# Patient Record
Sex: Male | Born: 1995 | Race: White | Hispanic: No | Marital: Single | Smoking: Never smoker
Health system: Southern US, Community
[De-identification: ages and names within clinical notes are randomized; demographics above are authoritative.]

## PROBLEM LIST (undated history)

## (undated) DIAGNOSIS — I370 Nonrheumatic pulmonary valve stenosis: Secondary | ICD-10-CM

## (undated) DIAGNOSIS — E119 Type 2 diabetes mellitus without complications: Secondary | ICD-10-CM

## (undated) HISTORY — PX: ANGIOPLASTY: SHX39

## (undated) HISTORY — PX: CARDIAC CATHETERIZATION: SHX172

---
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
## 2015-06-08 ENCOUNTER — Encounter (HOSPITAL_COMMUNITY): Payer: Self-pay | Admitting: Emergency Medicine

## 2015-06-08 ENCOUNTER — Observation Stay (HOSPITAL_COMMUNITY)
Admission: EM | Admit: 2015-06-08 | Discharge: 2015-06-09 | DRG: 639 | Disposition: A | Discharge status: Home / Self Care | Payer: 59 | Attending: Internal Medicine | Admitting: Internal Medicine

## 2015-06-08 DIAGNOSIS — R739 Hyperglycemia, unspecified: Secondary | ICD-10-CM

## 2015-06-08 DIAGNOSIS — Z833 Family history of diabetes mellitus: Secondary | ICD-10-CM

## 2015-06-08 DIAGNOSIS — E869 Volume depletion, unspecified: Secondary | ICD-10-CM | POA: Diagnosis present

## 2015-06-08 DIAGNOSIS — E119 Type 2 diabetes mellitus without complications: Secondary | ICD-10-CM | POA: Diagnosis not present

## 2015-06-08 DIAGNOSIS — E1165 Type 2 diabetes mellitus with hyperglycemia: Principal | ICD-10-CM | POA: Diagnosis present

## 2015-06-08 HISTORY — DX: Type 2 diabetes mellitus without complications: E11.9

## 2015-06-08 HISTORY — DX: Nonrheumatic pulmonary valve stenosis: I37.0

## 2015-06-08 LAB — URINALYSIS, ROUTINE W REFLEX MICROSCOPIC
Bilirubin Urine: NEGATIVE
Glucose, UA: >1000 mg/dL — AB
HGB URINE DIPSTICK: NEGATIVE
Ketones, ur: NEGATIVE mg/dL
Leukocytes, UA: NEGATIVE
Nitrite: NEGATIVE
PH: 7.5 (ref 5.0–8.0)
Protein, ur: NEGATIVE mg/dL
SPECIFIC GRAVITY, URINE: 1.041 — AB (ref 1.005–1.030)
UROBILINOGEN UA: 0.2 mg/dL (ref 0.0–1.0)

## 2015-06-08 LAB — CBC
HEMATOCRIT: 40.8 % (ref 39.0–52.0)
HEMOGLOBIN: 14.3 g/dL (ref 13.0–17.0)
MCH: 29.3 pg (ref 26.0–34.0)
MCHC: 35 g/dL (ref 30.0–36.0)
MCV: 83.6 fL (ref 78.0–100.0)
Platelets: 198 10*3/uL (ref 150–400)
RBC: 4.88 MIL/uL (ref 4.22–5.81)
RDW: 11.7 % (ref 11.5–15.5)
WBC: 6.3 10*3/uL (ref 4.0–10.5)

## 2015-06-08 LAB — CBG MONITORING, ED
GLUCOSE-CAPILLARY: 384 mg/dL — AB (ref 65–99)
GLUCOSE-CAPILLARY: 405 mg/dL — AB (ref 65–99)
Glucose-Capillary: 218 mg/dL — ABNORMAL HIGH (ref 65–99)

## 2015-06-08 LAB — BASIC METABOLIC PANEL
ANION GAP: 7 (ref 5–15)
BUN: 13 mg/dL (ref 6–20)
CHLORIDE: 100 mmol/L — AB (ref 101–111)
CO2: 26 mmol/L (ref 22–32)
Calcium: 9.3 mg/dL (ref 8.9–10.3)
Creatinine, Ser: 0.83 mg/dL (ref 0.61–1.24)
GFR calc Af Amer: >60 mL/min (ref >60)
GLUCOSE: 376 mg/dL — AB (ref 65–99)
POTASSIUM: 3.6 mmol/L (ref 3.5–5.1)
Sodium: 133 mmol/L — ABNORMAL LOW (ref 135–145)

## 2015-06-08 LAB — URINE MICROSCOPIC-ADD ON: Urine-Other: NONE SEEN

## 2015-06-08 LAB — GLUCOSE, CAPILLARY: Glucose-Capillary: 188 mg/dL — ABNORMAL HIGH (ref 65–99)

## 2015-06-08 MED ORDER — INSULIN REGULAR HUMAN 100 UNIT/ML IJ SOLN
INTRAMUSCULAR | Status: DC
  Administered 2015-06-08: 3.5 [IU]/h via INTRAVENOUS
  Filled 2015-06-08: qty 2.5

## 2015-06-08 MED ORDER — DEXTROSE-NACL 5-0.45 % IV SOLN
INTRAVENOUS | Status: DC
  Administered 2015-06-08: 21:00:00 via INTRAVENOUS

## 2015-06-08 MED ORDER — SODIUM CHLORIDE 0.9 % IV SOLN
1000.0000 mL | INTRAVENOUS | Status: DC
  Administered 2015-06-08: 1000 mL via INTRAVENOUS

## 2015-06-08 MED ORDER — SODIUM CHLORIDE 0.9 % IV SOLN
1000.0000 mL | Freq: Once | INTRAVENOUS | Status: AC
  Administered 2015-06-08: 1000 mL via INTRAVENOUS

## 2015-06-08 MED ORDER — INSULIN ASPART 100 UNIT/ML ~~LOC~~ SOLN
3.0000 [IU] | Freq: Three times a day (TID) | SUBCUTANEOUS | Status: DC
  Administered 2015-06-09: 3 [IU] via SUBCUTANEOUS

## 2015-06-08 MED ORDER — INSULIN GLARGINE 100 UNIT/ML ~~LOC~~ SOLN
8.0000 [IU] | Freq: Every day | SUBCUTANEOUS | Status: DC
  Administered 2015-06-08: 8 [IU] via SUBCUTANEOUS
  Filled 2015-06-08 (×2): qty 0.08

## 2015-06-08 MED ORDER — SODIUM CHLORIDE 0.9 % IV SOLN
INTRAVENOUS | Status: DC
  Administered 2015-06-08: via INTRAVENOUS

## 2015-06-08 MED ORDER — INSULIN ASPART 100 UNIT/ML ~~LOC~~ SOLN
0.0000 [IU] | Freq: Three times a day (TID) | SUBCUTANEOUS | Status: DC
  Administered 2015-06-09: 5 [IU] via SUBCUTANEOUS

## 2015-06-08 NOTE — H&P (Signed)
History and Physical  Scott Levy  ZOX:[id number]  DOB: 11-Feb-1996  DOA: 06/08/2015  Referring physician: Dr. Patria Mane PCP: No primary care provider on file.   Chief Complaint: "I've been peeing a lot and now my blood sugar is 400 mg/dL."  HPI: Scott Levy is a 19 y.o. male with no significant past medical history who presents with polyuria, polydipsia, and hyperglycemia.  The patient was in his usual state of health until the last few weeks when he started to notice frequent urination at night. He also noticed frequent dry mouth, and more vigorous thirst. In the last few days he suspected that he had diabetes and so he made an appointment for next week at an endocrinologist office this is on Tuesday. Then today he had one of his in-laws check his blood glucoses greater than 400 mg/dL so he went to urgent care where he was referred to the emergency room.  In the ED, the patient's blood sugar was greater than 400 mg/dL, he was slightly hyponatremic, but he had no acidosis or elevation in anion gap. His white blood cell count was normal. He was started on an insulin drip and admitted to [facility] for new onset diabetes.   Review of Systems:  Patient seen 9:13 PM on 06/08/2015. Pt complains of polyuria, polydipsia, dry mouth. Pt denies any prodromal illness, vision changes, abdominal pain, nausea, vomiting, malaise, weight loss.  Otherwise twelve systems were reviewed and were negative except as just noted or noted in the history of present illness.  Past Medical History  Diagnosis Date  . Pulmonary stenosis     age 72  . Diabetes mellitus without complication dx 06/08/15  The above past medical history was reviewed.  Past Surgical History  Procedure Laterality Date  . Angioplasty    . Cardiac catheterization  age 72  The above surgical history was reviewed.  Social History: Patient lives with his wife here in [place]. He owns his own business. He is a nonsmoker. He does not drink  alcohol daily.  No Known Allergies  Family History  Problem Relation Age of Onset  . Hypothyroidism Paternal Grandmother   . Diabetes type II Maternal Grandfather   . Pancreatic cancer Maternal Grandfather   There is also an uncle with recently diagnosed type 2 diabetes. There is a first cousin once removed who has type 1 diabetes.  Prior to Admission medications   Not on File    Physical Exam: BP 128/73 mmHg  Pulse 81  Temp(Src) 98.2 F (36.8 C) (Oral)  Resp 18  Ht 6' (1.829 m)  Wt 79.379 kg (175 lb)  BMI 23.73 kg/m2  SpO2 100% General: Adult male, alert and in obvious distress.  Responds appropriately to questions.  Eye contact, dress and hygiene appropriate. HEENT: Head normal.  Corneas clear, conjunctivae and sclerae normal without injection or icterus, lids and lashes normal.  PERRL and EOMI.  Nose normal.  OP moist without erythema, exudates, cobblestoning, or ulcers.  No airway deformities.  Neck supple. No thyromegaly.  Lymph: No cervical or axillary lymphadenopathy. Skin: Warm and dry.  No jaundice.  No suspicious rashes or lesions. Cardiac: RRR, nl S1-S2, no murmurs appreciated.  Capillary refill is less than 2 seconds.  No LE edema.  Radial and DP pulses 2+ and symmetric. Respiratory: Normal respiratory rate and rhythm.  CTAB without rales or wheezes. Abdomen: BS present.  No TTP or rebound all quadrants.  No masses or organomegaly.  No striae, dilated veins, rashes, or lesions.  No ascites, distension. Neuro: Sensorium intact.  Cranial nerves 3-12 intact.  Speech is fluent.  Naming is grossly intact, and the patient's recall, recent and remote, as well as general fund of knowledge seem within normal limits.  Muscle bulk normal.  5/5 grip strength and elbow and wrist flexion/extension, symmetrically.  5/5 hip flexion symmetrically.  Moves all extremities equally and with normal coordination.    Psych: Appropriate affect.  Normal rate and rhythm of speech.  Thought content  appropriate, and thought process linear.  No evidence of aural or visual hallucinations or delusions. Attention and concentration are normal.           Labs on Admission:  The metabolic panel is notable for hyponatremia with sodium 133 mmol/L, low normal potassium, normal bicarbonate, and normal renal function. The anion gap is 7. Blood glucose on his basic metabolic panel is 376 kg/dL.. The complete blood count is notable for normal WBC, no evidence of anemia. The urinalysis has marked glucosuria but no ketones.     Assessment/Plan Principal Problem:   Diabetes mellitus, new onset    1. New onset diabetes:  The patient is young, normal weight, and does not have an extensive family history of type 2 diabetes. -Discontinue insulin gtt -Start basal bolus regimen at 0.2 units per kilogram -Lantus 8 units nightly -Aspart 3 units 3 times daily with meals -Sliding scale corrections -Bedside glucose 3 times daily with meals at bedtime and at 3 AM -GAD65 and islet cell autoantibodies -Diabetes educator -Nutrition consult -The patient actually has an endocrinology appointment for this problem next Tuesday with Dr. Juleen China of [facility].     DVT PPx: Low risk Diet: Diabetic Code Status: Full Family Communication: The patient's diagnosis, expected workup and treatment plan were discussed with his wife and father present at the bedside.  All questions were answered.  Disposition Plan:  The appropriate admission status for this patient is INPATIENT. Inpatient status is judged to be reasonable and necessary in order to provide the required intensity of service to ensure the patient's safety. The patient's presenting symptoms, physical exam findings, and initial radiographic and laboratory data in the context of their chronic comorbidities is felt to place them at high risk for further clinical deterioration. Furthermore, it is not anticipated that the patient will be  medically stable for discharge from the hospital within 2 midnights of admission. The following factors support the admission status of inpatient.   A. The patient's presenting symptoms include polyuria, polydipsia. B. The worrisome physical exam findings include tachycardia. C. The initial radiographic and laboratory data are worrisome because of hyperglycemia. D. Patient requires inpatient status due to high intensity of service, high risk for further deterioration and high frequency of surveillance required. E. I certify that at the point of admission it is my clinical judgment that the patient will require inpatient hospital care spanning beyond 2 midnights from the point of admission.     Alberteen Sam [facility] Pager [phone]

## 2015-06-08 NOTE — ED Notes (Signed)
[facility] sent from [facility] for [facility]cemia. Pt has had excessive thirst, frequent urination, dry mouth over the past 2 weeks.  Pt has grandfather that was type 2 DM.

## 2015-06-08 NOTE — ED Provider Notes (Signed)
CSN: [id number]     Arrival date & time 06/08/15  4098 History   First MD Initiated Contact with Patient 06/08/15 1904     Chief Complaint  Patient presents with  . Hyperglycemia  . Polydipsia  . Urinary Frequency      HPI Patient presents the emergency department with 7-9 days of polyuria and polydipsia.  No significant weight change.  No prior history of diabetes.  Denies fevers and chills.  Denies nausea and vomiting.  No abdominal pain.  Symptoms are mild to moderate in severity.  Checked his blood sugar at home with a family member is a diabetic and his blood sugar was noted to be in the 400s.  Went to urgent care today and noted that his blood sugar was too high to count on their blood glucose machine.   History reviewed. No pertinent past medical history. Past Surgical History  Procedure Laterality Date  . Angioplasty     No family history on file. Social History  Substance Use Topics  . Smoking status: Never Smoker   . Smokeless tobacco: None  . Alcohol Use: No    Review of Systems  All other systems reviewed and are negative.     Allergies  Review of patient's allergies indicates no known allergies.  Home Medications   Prior to Admission medications   Medication Sig Start Date End Date Taking? Authorizing Provider  ibuprofen (ADVIL,MOTRIN) 200 MG tablet Take 800 mg by mouth every 6 (six) hours as needed for moderate pain.   Yes Historical Provider, MD   BP 128/73 mmHg  Pulse 81  Temp(Src) 98.2 F (36.8 C) (Oral)  Resp 18  Ht 6' (1.829 m)  Wt 175 lb (79.379 kg)  BMI 23.73 kg/m2  SpO2 100% Physical Exam  Constitutional: He is oriented to person, place, and time. He appears well-developed and well-nourished.  HENT:  Head: Normocephalic and atraumatic.  Eyes: EOM are normal.  Neck: Normal range of motion.  Cardiovascular: Normal rate, regular rhythm, normal heart sounds and intact distal pulses.   Pulmonary/Chest: Effort normal and breath sounds normal.  No respiratory distress.  Abdominal: Soft. He exhibits no distension. There is no tenderness.  Musculoskeletal: Normal range of motion.  Neurological: He is alert and oriented to person, place, and time.  Skin: Skin is warm and dry.  Psychiatric: He has a normal mood and affect. Judgment normal.  Nursing note and vitals reviewed.   ED Course  Procedures (including critical care time) Labs Review Labs Reviewed  BASIC METABOLIC PANEL - Abnormal; Notable for the following:    Sodium 133 (*)    Chloride 100 (*)    Glucose, Bld 376 (*)    All other components within normal limits  URINALYSIS, ROUTINE W REFLEX MICROSCOPIC (NOT AT [facility]) - Abnormal; Notable for the following:    Specific Gravity, Urine 1.041 (*)    Glucose, UA >1000 (*)    All other components within normal limits  CBG MONITORING, ED - Abnormal; Notable for the following:    Glucose-Capillary 384 (*)    All other components within normal limits  CBG MONITORING, ED - Abnormal; Notable for the following:    Glucose-Capillary 405 (*)    All other components within normal limits  CBC  URINE MICROSCOPIC-ADD ON    Imaging Review No results found. I have personally reviewed and evaluated these images and lab results as part of my medical decision-making.   EKG Interpretation None      MDM  Final diagnoses:  None    Patient be admitted the hospital for new-onset diabetes.  No signs of DKA at this time.  Likely volume depleted.  IV hydration.  IV insulin.    Azalia Bilis, MD 06/08/15 2035

## 2015-06-09 DIAGNOSIS — R739 Hyperglycemia, unspecified: Secondary | ICD-10-CM | POA: Diagnosis not present

## 2015-06-09 DIAGNOSIS — E119 Type 2 diabetes mellitus without complications: Secondary | ICD-10-CM

## 2015-06-09 LAB — BASIC METABOLIC PANEL
ANION GAP: 6 (ref 5–15)
BUN: 16 mg/dL (ref 6–20)
CHLORIDE: 105 mmol/L (ref 101–111)
CO2: 27 mmol/L (ref 22–32)
Calcium: 9.1 mg/dL (ref 8.9–10.3)
Creatinine, Ser: 0.86 mg/dL (ref 0.61–1.24)
GFR calc Af Amer: >60 mL/min (ref >60)
GLUCOSE: 221 mg/dL — AB (ref 65–99)
POTASSIUM: 4 mmol/L (ref 3.5–5.1)
Sodium: 138 mmol/L (ref 135–145)

## 2015-06-09 LAB — GLUCOSE, CAPILLARY
GLUCOSE-CAPILLARY: 79 mg/dL (ref 65–99)
GLUCOSE-CAPILLARY: 147 mg/dL — AB (ref 65–99)
Glucose-Capillary: 205 mg/dL — ABNORMAL HIGH (ref 65–99)
Glucose-Capillary: 207 mg/dL — ABNORMAL HIGH (ref 65–99)

## 2015-06-09 MED ORDER — FREESTYLE SYSTEM KIT: 1.0000 | PACK | Freq: Three times a day (TID) | Status: AC

## 2015-06-09 MED ORDER — INSULIN STARTER KIT- PEN NEEDLES (ENGLISH)
1.0000 | Freq: Once | Status: AC
  Administered 2015-06-09: 1
  Filled 2015-06-09: qty 1

## 2015-06-09 MED ORDER — INSULIN GLARGINE 100 UNITS/ML SOLOSTAR PEN: 30.0000 [IU] | PEN_INJECTOR | Freq: Every day | SUBCUTANEOUS | Status: AC

## 2015-06-09 MED ORDER — INSULIN STARTER KIT- PEN NEEDLES (ENGLISH): 1.0000 | Freq: Once | Status: AC

## 2015-06-09 MED ORDER — LIVING WELL WITH DIABETES BOOK
Freq: Once | Status: AC
  Administered 2015-06-09: 12:00:00
  Filled 2015-06-09: qty 1

## 2015-06-09 MED ORDER — INSULIN PEN NEEDLE 32G X 4 MM MISC: 1.0000 "application " | Freq: Every day | Status: AC

## 2015-06-09 MED ORDER — ALCOHOL PADS 70 % PADS: 1.0000 "application " | MEDICATED_PAD | Freq: Three times a day (TID) | Status: AC

## 2015-06-09 MED ORDER — BLOOD GLUCOSE TEST VI STRP: 1.0000 "application " | ORAL_STRIP | Freq: Three times a day (TID) | Status: AC

## 2015-06-09 MED ORDER — INSULIN GLARGINE 100 UNIT/ML ~~LOC~~ SOLN: 30.0000 [IU] | Freq: Every day | SUBCUTANEOUS | Status: DC

## 2015-06-09 NOTE — Discharge Instructions (Signed)
Blood Glucose Monitoring °Monitoring your blood glucose (also know as blood sugar) helps you to manage your diabetes. It also helps you and your health care provider monitor your diabetes and determine how well your treatment plan is working. °WHY SHOULD YOU MONITOR YOUR BLOOD GLUCOSE? °· It can help you understand how food, exercise, and medicine affect your blood glucose. °· It allows you to know what your blood glucose is at any given moment. You can quickly tell if you are having low blood glucose (hypoglycemia) or high blood glucose (hyperglycemia). °· It can help you and your health care provider know how to adjust your medicines. °· It can help you understand how to manage an illness or adjust medicine for exercise. °WHEN SHOULD YOU TEST? °Your health care provider will help you decide how often you should check your blood glucose. This may depend on the type of diabetes you have, your diabetes control, or the types of medicines you are taking. Be sure to write down all of your blood glucose readings so that this information can be reviewed with your health care provider. See below for examples of testing times that your health care provider may suggest. °Type 1 Diabetes °· Test 4 times a day if you are in good control, using an insulin pump, or perform multiple daily injections. °· If your diabetes is not well controlled or if you are sick, you may need to monitor more often. °· It is a good idea to also monitor: °· Before and after exercise. °· Between meals and 2 hours after a meal. °· Occasionally between 2:00 a.m. and 3:00 a.m. °Type 2 Diabetes °· It can vary with each person, but generally, if you are on insulin, test 4 times a day. °· If you take medicines by mouth (orally), test 2 times a day. °· If you are on a controlled diet, test once a day. °· If your diabetes is not well controlled or if you are sick, you may need to monitor more often. °HOW TO MONITOR YOUR BLOOD GLUCOSE °Supplies  Needed °· Blood glucose meter. °· Test strips for your meter. Each meter has its own strips. You must use the strips that go with your own meter. °· A pricking needle (lancet). °· A device that holds the lancet (lancing device). °· A journal or log book to write down your results. °Procedure °· Wash your hands with soap and water. Alcohol is not preferred. °· Prick the side of your finger (not the tip) with the lancet. °· Gently milk the finger until a small drop of blood appears. °· Follow the instructions that come with your meter for inserting the test strip, applying blood to the strip, and using your blood glucose meter. °Other Areas to Get Blood for Testing °Some meters allow you to use other areas of your body (other than your finger) to test your blood. These areas are called alternative sites. The most common alternative sites are: °· The forearm. °· The thigh. °· The back area of the lower leg. °· The palm of the hand. °The blood flow in these areas is slower. Therefore, the blood glucose values you get may be delayed, and the numbers are different from what you would get from your fingers. Do not use alternative sites if you think you are having hypoglycemia. Your reading will not be accurate. Always use a finger if you are having hypoglycemia. Also, if you cannot feel your lows (hypoglycemia unawareness), always use your fingers for your   blood glucose checks. °ADDITIONAL TIPS FOR GLUCOSE MONITORING °· Do not reuse lancets. °· Always carry your supplies with you. °· All blood glucose meters have a 24-hour "hotline" number to call if you have questions or need help. °· Adjust (calibrate) your blood glucose meter with a control solution after finishing a few boxes of strips. °BLOOD GLUCOSE RECORD KEEPING °It is a good idea to keep a daily record or log of your blood glucose readings. Most glucose meters, if not all, keep your glucose records stored in the meter. Some meters come with the ability to download  your records to your home computer. Keeping a record of your blood glucose readings is especially helpful if you are wanting to look for patterns. Make notes to go along with the blood glucose readings because you might forget what happened at that exact time. Keeping good records helps you and your health care provider to work together to achieve good diabetes management.  °Document Released: 08/28/2003 Document Revised: 01/09/2014 Document Reviewed: 01/17/2013 °ExitCare® Patient Information ©2015 [employer]. This information is not intended to replace advice given to you by your health care provider. Make sure you discuss any questions you have with your health care provider. ° °Diabetes Mellitus and Food °It is important for you to manage your blood sugar (glucose) level. Your blood glucose level can be greatly affected by what you eat. Eating healthier foods in the appropriate amounts throughout the day at about the same time each day will help you control your blood glucose level. It can also help slow or prevent worsening of your diabetes mellitus. Healthy eating may even help you improve the level of your blood pressure and reach or maintain a healthy weight.  °HOW CAN FOOD AFFECT ME? °Carbohydrates °Carbohydrates affect your blood glucose level more than any other type of food. Your dietitian will help you determine how many carbohydrates to eat at each meal and teach you how to count carbohydrates. Counting carbohydrates is important to keep your blood glucose at a healthy level, especially if you are using insulin or taking certain medicines for diabetes mellitus. °Alcohol °Alcohol can cause sudden decreases in blood glucose (hypoglycemia), especially if you use insulin or take certain medicines for diabetes mellitus. Hypoglycemia can be a life-threatening condition. Symptoms of hypoglycemia (sleepiness, dizziness, and disorientation) are similar to symptoms of having too much alcohol.  °If your health  care provider has given you approval to drink alcohol, do so in moderation and use the following guidelines: °· Women should not have more than one drink per day, and men should not have more than two drinks per day. One drink is equal to: °¨ 12 oz of beer. °¨ 5 oz of wine. °¨ 1½ oz of hard liquor. °· Do not drink on an empty stomach. °· Keep yourself hydrated. Have water, diet soda, or unsweetened iced tea. °· Regular soda, juice, and other mixers might contain a lot of carbohydrates and should be counted. °WHAT FOODS ARE NOT RECOMMENDED? °As you make food choices, it is important to remember that all foods are not the same. Some foods have fewer nutrients per serving than other foods, even though they might have the same number of calories or carbohydrates. It is difficult to get your body what it needs when you eat foods with fewer nutrients. Examples of foods that you should avoid that are high in calories and carbohydrates but low in nutrients include: °· Trans fats (most processed foods list trans fats on the   Nutrition Facts label). °· Regular soda. °· Juice. °· Candy. °· Sweets, such as cake, pie, doughnuts, and cookies. °· Fried foods. °WHAT FOODS CAN I EAT? °Have nutrient-rich foods, which will nourish your body and keep you healthy. The food you should eat also will depend on several factors, including: °· The calories you need. °· The medicines you take. °· Your weight. °· Your blood glucose level. °· Your blood pressure level. °· Your cholesterol level. °You also should eat a variety of foods, including: °· Protein, such as meat, poultry, fish, tofu, nuts, and seeds (lean animal proteins are best). °· Fruits. °· Vegetables. °· Dairy products, such as milk, cheese, and yogurt (low fat is best). °· Breads, grains, pasta, cereal, rice, and beans. °· Fats such as olive oil, trans fat-free margarine, canola oil, avocado, and olives. °DOES EVERYONE WITH DIABETES MELLITUS HAVE THE SAME MEAL PLAN? °Because every  person with diabetes mellitus is different, there is not one meal plan that works for everyone. It is very important that you meet with a dietitian who will help you create a meal plan that is just right for you. °Document Released: 05/22/2005 Document Revised: 08/30/2013 Document Reviewed: 07/22/2013 °ExitCare® Patient Information ©2015 [employer]. This information is not intended to replace advice given to you by your health care provider. Make sure you discuss any questions you have with your health care provider. ° °

## 2015-06-09 NOTE — Progress Notes (Signed)
Patient watching educational videos on diabetes.

## 2015-06-09 NOTE — Progress Notes (Signed)
Patient discharged and leaving floor accompanied by mother.  NT accompanying patient and mother upon leaving floor.  Patient ambulatory at discharge.  No complaints.  No s/s of hypo or hyperglycemia.  Leaving with prescriptions and education materials regarding diabetes.  Denies pain.  Room air.  No s/s of distress.

## 2015-06-09 NOTE — Progress Notes (Signed)
Inpatient Diabetes Program Recommendations  AACE/ADA: New Consensus Statement on Inpatient Glycemic Control (2015)  Target Ranges:  Prepandial:   less than 140 mg/dL      Peak postprandial:   less than 180 mg/dL (1-2 hours)      Critically ill patients:  140 - 180 mg/dL   Review of Glycemic Control  Diabetes history: None Current orders for Inpatient glycemic control: Lantus 8 units QHS, Novolog Moderate + Novolog 3 units TID meal coverage  Inpatient Diabetes Program Recommendations: Insulin - Basal: Fasting glucose was still greater than 200 mg/dl after 8 units of Lantus. Please consider increasing basal insulin to 12 units QHS. HgbA1C: A1c, GAD, Anti-islet pending Outpatient Referral: Patient has insurance and will qualify for Outpatient DM Education. Please place order when appropriate.  Note: Differentiating between DM 1 or 2. Ordered Living well with DM book. Dietitian consult already ordered by MD.  RN to have patient watch educational videos and teach patient survival skills before discharge: s/s hypoglycemia and treatment s/s hyperglycemia and treatment A1c results, goal is 7% or less (150 avg glucose) New medications they will be on Check glucose and how often (1-2 times for oral meds, 3-4 times a day with insulin) Teach how to administer insulin with each insulin administration dose scheduled Watching carb intake (45-60 g/females/meal 15 g/snack, 60-75 g/males 15-30 g/snack), plate method, watching beverage options.  Will wait for results of A1c, GAD, Anti-islet tests to determine level of education the patient needs. Will speak to patient before discharge once teaching has been started so I can reinforce education. If gaps exist patient qualifies for Outpatient Education.  Thanks,  Christena Deem RN, MSN, [facility] Inpatient Diabetes Coordinator Team Pager [phone] (8a-5p)

## 2015-06-09 NOTE — Plan of Care (Signed)
Problem: Food- and Nutrition-Related Knowledge Deficit (NB-1.1) Goal: Nutrition education Formal process to instruct or train a patient/client in a skill or to impart knowledge to help patients/clients voluntarily manage or modify food choices and eating behavior to maintain or improve health. Outcome: Adequate for Discharge  RD consulted for nutrition education regarding diabetes.   No results found for: HGBA1C   Received page from RN at [facility]; pt is being discharged home today and MD is requesting RD consult prior to discharge. Spoke with pt via phone in room. He reports he is finishing watching the DM education videos. Pt had very thoughtful, insightful questions. 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 Usual diet is meat, vegetables, and water. He is agreeable to Outpatient DSME, which has been set-up. Pt agreeable to have handouts sent to his home for further reinforcement.   RD provided "Carbohydrate Counting for People with Diabetes" handout from the Academy of Nutrition and Dietetics. Discussed different food groups and their effects on blood sugar, emphasizing carbohydrate-containing foods. Provided list of carbohydrates and recommended serving sizes of common foods.  Discussed importance of controlled and consistent carbohydrate intake throughout the day. Provided examples of ways to balance meals/snacks and encouraged intake of high-fiber, whole grain complex carbohydrates. Teach back method used.  Expect good compliance.  Body mass index is 24.24 kg/(m^2). Pt meets criteria for normal weight range based on current BMI.  Current diet order is Carb Modified, patient is consuming approximately 100% of meals at this time. Labs and medications reviewed. No further nutrition interventions warranted at this time. RD contact information provided. If additional nutrition issues arise, please re-consult RD.  Meagan Ancona A. Mayford Knife, RD, LDN,  CDE Pager: [phone] After hours Pager: [phone]

## 2015-06-09 NOTE — Progress Notes (Signed)
Patient being discharged.  Provided education on s/s of hypoglycemia and hyperglycemia and medications at discharge.  Patient reports he watched diabetes education videos.   Last CBG 147.

## 2015-06-09 NOTE — Discharge Summary (Addendum)
Physician Discharge Summary  Male Scott Levy:[id number] DOB: 01/11/1996 DOA: 06/08/2015  PCP: No primary care provider on file.  Admit date: 06/08/2015 Discharge date: 06/09/2015  Time spent: 25 minutes  Recommendations for Outpatient Follow-up:  1. Follow up with Dr Anda Kraft on 10/4 at 4 pm 2. Pt being discharged on lantus 30 units at bedtime. Follow A1C and islet cell ab results as outpt   Discharge Diagnoses:  Principal Problem:   Diabetes mellitus, new onset   Active Problems:   Hyperglycemia   Discharge Condition: fair  Diet recommendation: diabetic  Filed Weights   06/08/15 1858 06/08/15 1943 06/08/15 2155  Weight: 79.379 kg (175 lb) 79.379 kg (175 lb) 81.103 kg (178 lb 12.8 oz)    History of present illness:    19 y.o. male with no significant past medical history who presents with polyuria, polydipsia, and hyperglycemia. The patient was in his usual state of health until the last few weeks when he started to notice frequent urination at night. He also noticed frequent dry mouth, and more vigorous thirst. In the last few days he suspected that he had diabetes and so he made an appointment at an endocrinologist office this  Tuesday. On the day of admission,  he had one of his aunt  check his blood glucoses and was greater than 400 mg/dL so he went to urgent care where he was referred to the emergency room.  In the ED, the patient's blood sugar was greater than 400 mg/dL, he was slightly hyponatremic, but he had no acidosis or elevation in anion gap. His white blood cell count was normal. He was started on an insulin drip and admitted to [facility] for new onset diabetes.    Hospital Course:  Marland Kitchen New onset diabetes:  The patient is young, normal weight, and does has an extensive family history of type 2 diabetes. -fsg improves to 200s on insulin drip, was discontinued.  -given lantus 8 units in the night and monitored on premeal aspart. -A1C and GAD65 and islet cell  autoantibodies sent and should be followed as outpt.  fsg  this am improved to 79. -seen by Diabetes educator. nutritionist consulted.  Patient taught on using insulin pen and saw videos on diabetes. The patient  has an endocrinology appointment for this problem next Tuesday with Dr. Wilson Singer at [facility]. -he will be discharged home on lantus 30 units daily, prescribed insulin pen, and supplies incliding glucometer, needles , test trips and lancets. i have instructed him to monitor his fsg 3 times a day and keep a log of it to show it to Dr Wilson Singer.  discussed in detail on diet, exercise and medication adherence along with lifestyle modifications. - Pt feels better today. Polyuria and polydipsia resolved. Can be discharged home.    Diet: diabetic   Procedures:  none  Consultations:  None  Family communications:  dicussed in details with pt and his mother at bedside  Discharge Exam: Filed Vitals:   06/09/15 0456  BP: 122/74  Pulse: 62  Temp: 97.6 F (36.4 C)  Resp: 18    General:  NAD  HEENT: no pallor, moist mucosa Chest: clear b/l  CVS: NS1&S2, no murmurs GI: soft, NT, ND musculoskeletal: warm, no edema    Discharge Instructions    Current Discharge Medication List    START taking these medications   Details  Alcohol Swabs (ALCOHOL PADS) 70 % PADS 1 application by Does not apply route 3 (three) times daily. Qty: 100  each, Refills: 0    Glucose Blood (BLOOD GLUCOSE TEST STRIPS) STRP 1 application by In Vitro route 3 (three) times daily. Qty: 100 each, Refills: 0    glucose monitoring kit (FREESTYLE) monitoring kit 1 each by Does not apply route 4 (four) times daily - after meals and at bedtime. 1 month Diabetic Testing Supplies for QAC-QHS accuchecks. Qty: 1 each, Refills: 1    insulin glargine (LANTUS) 100 unit/mL SOPN Inject 0.3 mLs (30 Units total) into the skin at bedtime. Qty: 15 mL, Refills: 5    Insulin Pen Needle 32G X 4 MM MISC  1 application by Does not apply route at bedtime. Qty: 100 each, Refills: 0    insulin starter kit- pen needles MISC 1 kit by Other route once. Qty: 1 kit, Refills: 0       No Known Allergies Follow-up Information    Follow up with Scott Bolt, MD On 06/12/2015.   Specialty:  Endocrinology   Why:  4 pm   Contact information:   [address] [place] [address] [phone]        The results of significant diagnostics from this hospitalization (including imaging, microbiology, ancillary and laboratory) are listed below for reference.    Significant Diagnostic Studies: No results found.  Microbiology: No results found for this or any previous visit (from the past 240 hour(s)).   Labs: Basic Metabolic Panel:  Recent Labs Lab 06/08/15 1940 06/09/15 0601  NA 133* 138  K 3.6 4.0  CL 100* 105  CO2 26 27  GLUCOSE 376* 221*  BUN 13 16  CREATININE 0.83 0.86  CALCIUM 9.3 9.1   Liver Function Tests: No results for input(s): AST, ALT, ALKPHOS, BILITOT, PROT, ALBUMIN in the last 168 hours. No results for input(s): LIPASE, AMYLASE in the last 168 hours. No results for input(s): AMMONIA in the last 168 hours. CBC:  Recent Labs Lab 06/08/15 1940  WBC 6.3  HGB 14.3  HCT 40.8  MCV 83.6  PLT 198   Cardiac Enzymes: No results for input(s): CKTOTAL, CKMB, CKMBINDEX, TROPONINI in the last 168 hours. BNP: BNP (last 3 results) No results for input(s): BNP in the last 8760 hours.  ProBNP (last 3 results) No results for input(s): PROBNP in the last 8760 hours.  CBG:  Recent Labs Lab 06/08/15 2057 06/08/15 2300 06/09/15 0455 06/09/15 0750 06/09/15 1058  GLUCAP 218* 188* 205* 207* 79       Signed:  Brynda Heick  [facility] 06/09/2015, 11:04 AM

## 2015-06-09 NOTE — Progress Notes (Signed)
Spoke with Dietician.  She is at [facility] and reports she will call patient in his room to speak with him about diet within the next 15 minutes.

## 2015-06-09 NOTE — Progress Notes (Signed)
Pt arrived to unit room 1518. Alert and oriented x 3, steady gait, no complaint of pain. VS taken, pt oriented to room and callbell with no complications.. Pt guide at the bedside. Initial assessment completed, will continue to monitor.

## 2015-06-11 LAB — ANTI-ISLET CELL ANTIBODY: PANCREATIC ISLET CELL ANTIBODY: NEGATIVE

## 2015-06-11 LAB — HEMOGLOBIN A1C
HEMOGLOBIN A1C: 9.6 % — AB (ref 4.8–5.6)
MEAN PLASMA GLUCOSE: 229 mg/dL

## 2015-06-12 LAB — GLUTAMIC ACID DECARBOXYLASE AUTO ABS: Glutamic Acid Decarb Ab: 52.9 U/mL — ABNORMAL HIGH (ref 0.0–5.0)

## 2019-07-28 ENCOUNTER — Other Ambulatory Visit: Payer: Self-pay

## 2019-07-28 DIAGNOSIS — Z20822 Contact with and (suspected) exposure to covid-19: Secondary | ICD-10-CM

## 2019-07-31 LAB — NOVEL CORONAVIRUS, NAA: SARS-CoV-2, NAA: NOT DETECTED

## 2020-12-23 ENCOUNTER — Emergency Department (HOSPITAL_COMMUNITY): Payer: 59

## 2020-12-23 ENCOUNTER — Other Ambulatory Visit: Payer: Self-pay

## 2020-12-23 ENCOUNTER — Emergency Department (HOSPITAL_COMMUNITY)
Admission: EM | Admit: 2020-12-23 | Discharge: 2020-12-23 | Disposition: A | Discharge status: Home / Self Care | Payer: 59 | Attending: Emergency Medicine | Admitting: Emergency Medicine

## 2020-12-23 ENCOUNTER — Encounter (HOSPITAL_COMMUNITY): Payer: Self-pay | Admitting: Emergency Medicine

## 2020-12-23 DIAGNOSIS — R1011 Right upper quadrant pain: Secondary | ICD-10-CM | POA: Diagnosis present

## 2020-12-23 DIAGNOSIS — Z794 Long term (current) use of insulin: Secondary | ICD-10-CM | POA: Diagnosis not present

## 2020-12-23 DIAGNOSIS — E119 Type 2 diabetes mellitus without complications: Secondary | ICD-10-CM | POA: Insufficient documentation

## 2020-12-23 DIAGNOSIS — R1110 Vomiting, unspecified: Secondary | ICD-10-CM | POA: Diagnosis not present

## 2020-12-23 DIAGNOSIS — R1013 Epigastric pain: Secondary | ICD-10-CM | POA: Diagnosis not present

## 2020-12-23 DIAGNOSIS — M545 Low back pain, unspecified: Secondary | ICD-10-CM

## 2020-12-23 LAB — COMPREHENSIVE METABOLIC PANEL
ALT: 19 U/L (ref 0–44)
AST: 17 U/L (ref 15–41)
Albumin: 4.5 g/dL (ref 3.5–5.0)
Alkaline Phosphatase: 65 U/L (ref 38–126)
Anion gap: 9 (ref 5–15)
BUN: 12 mg/dL (ref 6–20)
CO2: 24 mmol/L (ref 22–32)
Calcium: 9.5 mg/dL (ref 8.9–10.3)
Chloride: 102 mmol/L (ref 98–111)
Creatinine, Ser: 0.88 mg/dL (ref 0.61–1.24)
GFR, Estimated: >60 mL/min (ref >60)
Glucose, Bld: 201 mg/dL — ABNORMAL HIGH (ref 70–99)
Potassium: 4 mmol/L (ref 3.5–5.1)
Sodium: 135 mmol/L (ref 135–145)
Total Bilirubin: 1.4 mg/dL — ABNORMAL HIGH (ref 0.3–1.2)
Total Protein: 7.4 g/dL (ref 6.5–8.1)

## 2020-12-23 LAB — CBC
HCT: 49.2 % (ref 39.0–52.0)
Hemoglobin: 16.8 g/dL (ref 13.0–17.0)
MCH: 29 pg (ref 26.0–34.0)
MCHC: 34.1 g/dL (ref 30.0–36.0)
MCV: 84.8 fL (ref 80.0–100.0)
Platelets: 222 10*3/uL (ref 150–400)
RBC: 5.8 MIL/uL (ref 4.22–5.81)
RDW: 11.4 % — ABNORMAL LOW (ref 11.5–15.5)
WBC: 6.9 10*3/uL (ref 4.0–10.5)
nRBC: 0 % (ref 0.0–0.2)

## 2020-12-23 LAB — URINALYSIS, ROUTINE W REFLEX MICROSCOPIC
Bilirubin Urine: NEGATIVE
Glucose, UA: NEGATIVE mg/dL
Hgb urine dipstick: NEGATIVE
Ketones, ur: NEGATIVE mg/dL
Leukocytes,Ua: NEGATIVE
Nitrite: NEGATIVE
Protein, ur: NEGATIVE mg/dL
Specific Gravity, Urine: 1.019 (ref 1.005–1.030)
pH: 5 (ref 5.0–8.0)

## 2020-12-23 LAB — CBG MONITORING, ED: Glucose-Capillary: 190 mg/dL — ABNORMAL HIGH (ref 70–99)

## 2020-12-23 LAB — LIPASE, BLOOD: Lipase: 30 U/L (ref 11–51)

## 2020-12-23 MED ORDER — IOHEXOL 300 MG/ML  SOLN
100.0000 mL | Freq: Once | INTRAMUSCULAR | Status: AC | PRN
  Administered 2020-12-23: 100 mL via INTRAVENOUS

## 2020-12-23 NOTE — ED Provider Notes (Signed)
[place] EMERGENCY DEPARTMENT Provider Note   CSN: [id number] Arrival date & time: 12/23/20  1239     History Chief Complaint  Patient presents with  . Flank Pain    Scott Levy is a 25 y.o. male.  25 year old male presents with right upper quadrant abdominal pain that began after eating a hamburger several days ago.  No fever or chills.  With emesis x1 today.  Pain starts in his epigastric area and goes her upper quadrant into his back.  No cough or congestion.  No urinary symptoms.  No prior history of same.  Concerned that he may be having constipation has been using laxatives without relief.  Does have a history of diabetes        Past Medical History:  Diagnosis Date  . Diabetes mellitus without complication ([place]) dx 0/96/43  . Pulmonary stenosis    age 89    Patient Active Problem List   Diagnosis Date Noted  . Hyperglycemia 06/09/2015  . Diabetes mellitus, new onset ([place]) 06/08/2015    Past Surgical History:  Procedure Laterality Date  . ANGIOPLASTY    . CARDIAC CATHETERIZATION  age 89       Family History  Problem Relation Age of Onset  . Hypothyroidism Paternal Grandmother   . Diabetes type II Maternal Grandfather   . Pancreatic cancer Maternal Grandfather     Social History   Tobacco Use  . Smoking status: Never Smoker  . Smokeless tobacco: Never Used  Substance Use Topics  . Alcohol use: No  . Drug use: Not Currently    Home Medications Prior to Admission medications   Medication Sig Start Date End Date Taking? Authorizing Provider  Alcohol Swabs (ALCOHOL PADS) 70 % PADS 1 application by Does not apply route 3 (three) times daily. 06/09/15   Dhungel, Nishant, MD  Glucose Blood (BLOOD GLUCOSE TEST STRIPS) STRP 1 application by In Vitro route 3 (three) times daily. 06/09/15   Dhungel, Flonnie Overman, MD  glucose monitoring kit (FREESTYLE) monitoring kit 1 each by Does not apply route 4 (four) times daily - after meals and at  bedtime. 1 month Diabetic Testing Supplies for QAC-QHS accuchecks. 06/09/15   Dhungel, Nishant, MD  insulin glargine (LANTUS) 100 unit/mL SOPN Inject 0.3 mLs (30 Units total) into the skin at bedtime. 06/09/15   Dhungel, Nishant, MD  Insulin Pen Needle 32G X 4 MM MISC 1 application by Does not apply route at bedtime. 06/09/15   Dhungel, Flonnie Overman, MD  insulin starter kit- pen needles MISC 1 kit by Other route once. 06/09/15   Dhungel, Flonnie Overman, MD    Allergies    Patient has no known allergies.  Review of Systems   Review of Systems  All other systems reviewed and are negative.   Physical Exam Updated Vital Signs BP (!) 148/89 (BP Location: Right Arm)   Pulse 69   Temp 98.5 F (36.9 C) (Oral)   Resp 18   SpO2 100%   Physical Exam Vitals and nursing note reviewed.  Constitutional:      General: He is not in acute distress.    Appearance: Normal appearance. He is well-developed. He is not toxic-appearing.  HENT:     Head: Normocephalic and atraumatic.  Eyes:     General: Lids are normal.     Conjunctiva/sclera: Conjunctivae normal.     Pupils: Pupils are equal, round, and reactive to light.  Neck:     Thyroid: No thyroid mass.  Trachea: No tracheal deviation.  Cardiovascular:     Rate and Rhythm: Normal rate and regular rhythm.     Heart sounds: Normal heart sounds. No murmur heard. No gallop.   Pulmonary:     Effort: Pulmonary effort is normal. No respiratory distress.     Breath sounds: Normal breath sounds. No stridor. No decreased breath sounds, wheezing, rhonchi or rales.  Abdominal:     General: Bowel sounds are normal. There is no distension.     Palpations: Abdomen is soft.     Tenderness: There is abdominal tenderness in the right upper quadrant and epigastric area. There is guarding. There is no rebound.    Musculoskeletal:        General: No tenderness. Normal range of motion.     Cervical back: Normal range of motion and neck supple.  Skin:    General: Skin  is warm and dry.     Findings: No abrasion or rash.  Neurological:     Mental Status: He is alert and oriented to person, place, and time.     GCS: GCS eye subscore is 4. GCS verbal subscore is 5. GCS motor subscore is 6.     Cranial Nerves: No cranial nerve deficit.     Sensory: No sensory deficit.  Psychiatric:        Speech: Speech normal.        Behavior: Behavior normal.     ED Results / Procedures / Treatments   Labs (all labs ordered are listed, but only abnormal results are displayed) Labs Reviewed  CBC - Abnormal; Notable for the following components:      Result Value   RDW 11.4 (*)    All other components within normal limits  CBG MONITORING, ED - Abnormal; Notable for the following components:   Glucose-Capillary 190 (*)    All other components within normal limits  LIPASE, BLOOD  COMPREHENSIVE METABOLIC PANEL  URINALYSIS, ROUTINE W REFLEX MICROSCOPIC    EKG None  Radiology No results found.  Procedures Procedures   Medications Ordered in ED Medications - No data to display  ED Course  I have reviewed the triage vital signs and the nursing notes.  Pertinent labs & imaging results that were available during my care of the patient were reviewed by me and considered in my medical decision making (see chart for details).    MDM Rules/Calculators/A&P                         Labs here are reassuring.  Right upper quadrant ultrasound negative.  Abdominal CT ordered and pending at this time.  Signed out to Dr. Ronnald Nian Final Clinical Impression(s) / ED Diagnoses Final diagnoses:  None    Rx / DC Orders ED Discharge Orders    None       Lacretia Leigh, MD 12/23/20 [phone]

## 2020-12-23 NOTE — ED Notes (Signed)
Patient transported to US 

## 2020-12-23 NOTE — Discharge Instructions (Addendum)
Recommend 1000 mg of Tylenol 4 times a day as needed for pain.  Recommend 600 mg of ibuprofen 3 times a day as needed.  Do not do any heavy lifting for the next week.  Follow-up with your primary care doctor to discuss if you need MRI to further evaluate for likely hemangioma of your liver.

## 2020-12-23 NOTE — ED Provider Notes (Signed)
Patient with bilateral flank pain.  CT scan negative for kidney stone.  No appendicitis or bowel obstruction.  Gallbladder and liver enzymes within normal limits.  Likely hemangioma on CT scan and ultrasound but recommend follow-up with primary care doctor to discuss possible need for MRI to ensure that this is a hemangioma.  Overall suspect musculoskeletal pain.  Discharged in good condition.  Understands return precautions.  This chart was dictated using voice recognition software.  Despite best efforts to proofread,  errors can occur which can change the documentation meaning.     Virgina Norfolk, DO 12/23/20 [phone]

## 2020-12-23 NOTE — ED Triage Notes (Signed)
Reports indigestion after eating burgers on Friday.  States abd pain started radiating to lower back.  Now having bilateral flank pain and nausea.  Vomited x 1 this morning.  Denies urinary complaints.  Reports constipation.  Took Senokot and had a few BMs but still having pain.

## 2020-12-23 NOTE — ED Notes (Signed)
Patient given discharge paperwork and instructions. Verbalized understanding of teaching. IV d/c with cath tip intact. Ambulatory to exit in NAD with steady gait. 

## 2022-02-09 IMAGING — CT CT ABD-PELV W/ CM
2 of 4 series · 16 of 46 positions shown, 18 images · IV contrast (APPLIED)
Comparison: Ultrasound from earlier in the same day.

CLINICAL DATA: Abdominal distension for 1 day

EXAM:
CT ABDOMEN AND PELVIS WITH CONTRAST
TECHNIQUE: Multidetector CT imaging of the abdomen and pelvis was performed
using the standard protocol following bolus administration of
intravenous contrast.
CONTRAST:  100mL OMNIPAQUE IOHEXOL 300 MG/ML  SOLN

[Series 3: abd/ pelvis 5.0 i30f 2 · axial · 0.95mm/px · z∈[+741,+1181]mm · 13 of 97 slices shown, 15 images]
[im 5/97  soft-tissue]
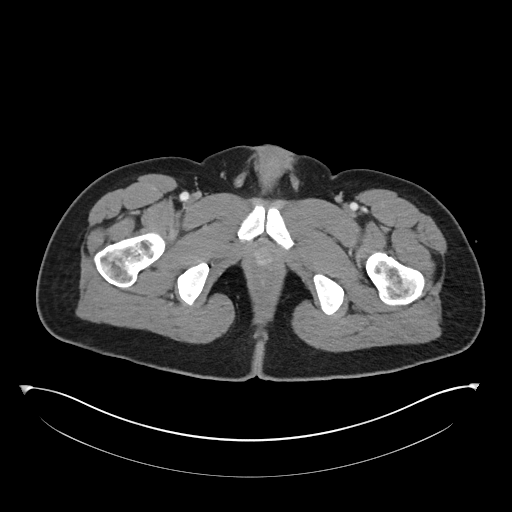
[im 5/97  bone]
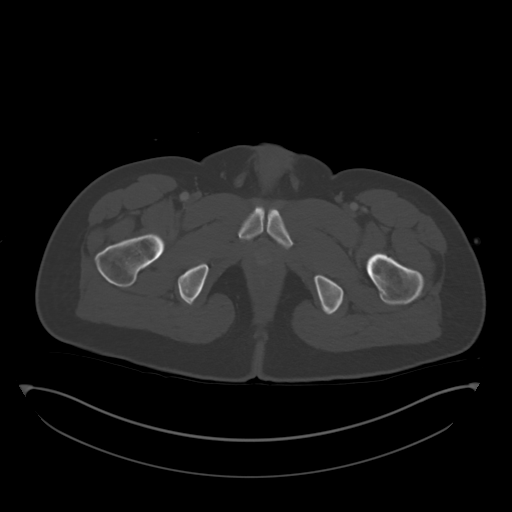
[im 13/97  soft-tissue]
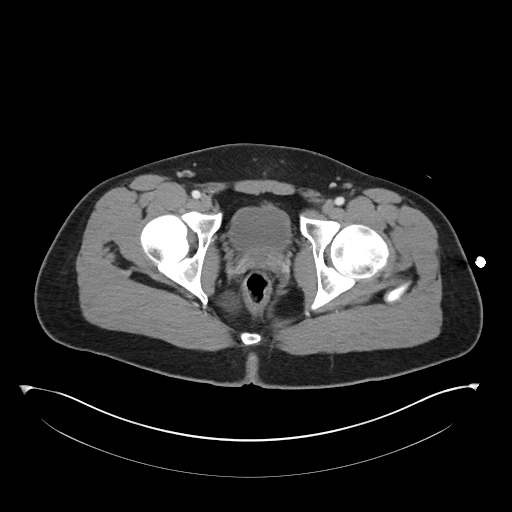
[im 21/97  soft-tissue]
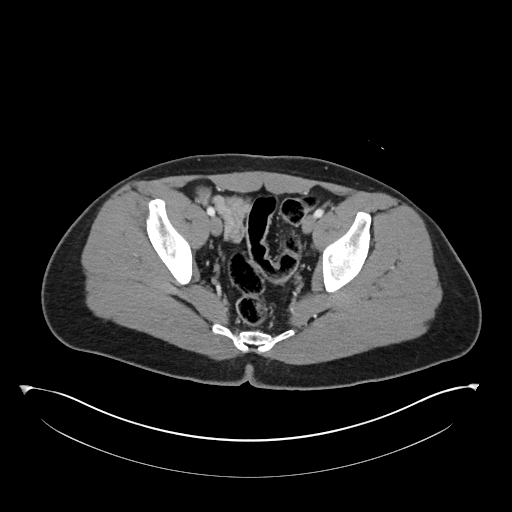
[im 29/97  soft-tissue]
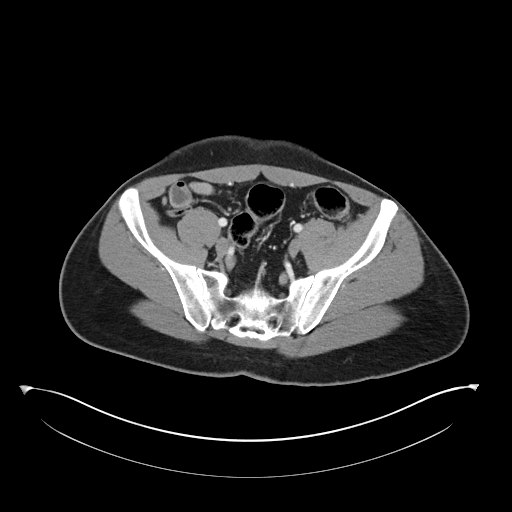
[im 33/97  soft-tissue]
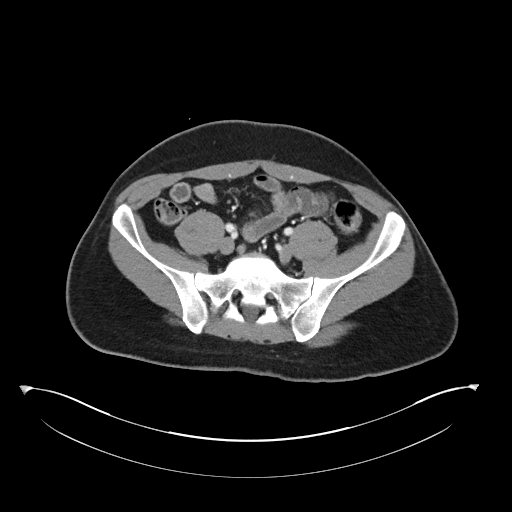
[im 41/97  soft-tissue]
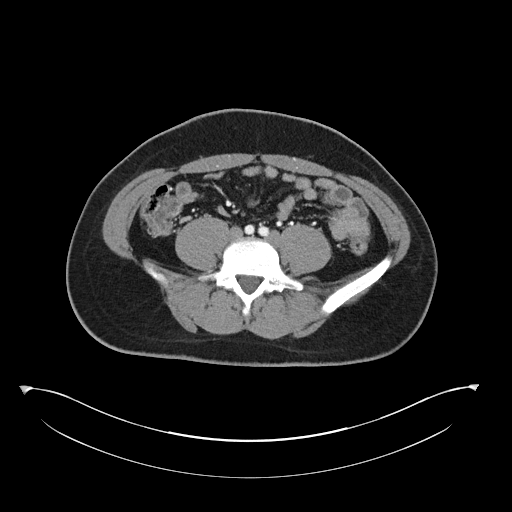
[im 49/97  soft-tissue]
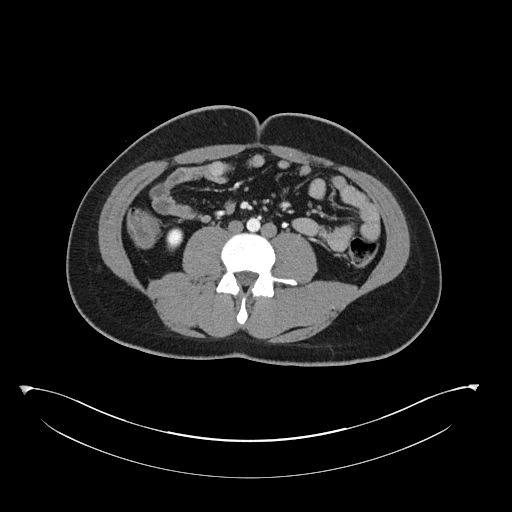
[im 57/97  soft-tissue]
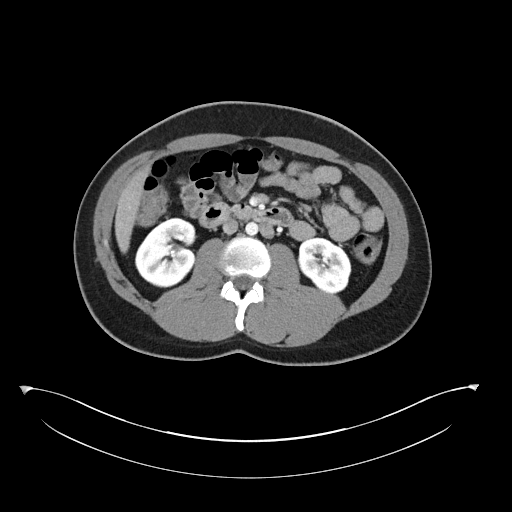
[im 65/97  soft-tissue]
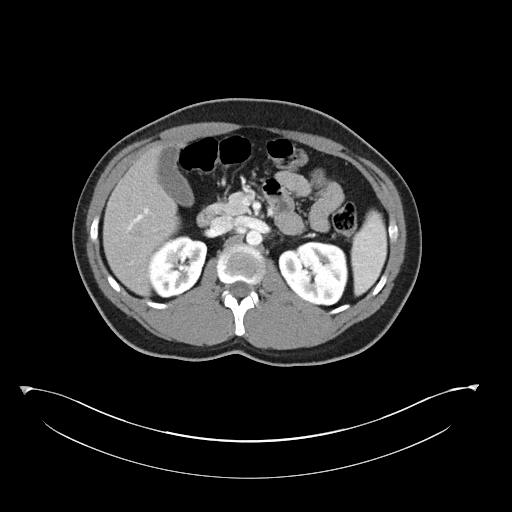
[im 65/97  bone]
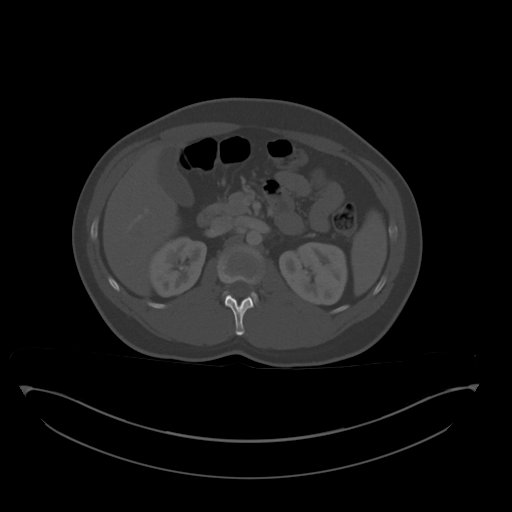
[im 69/97  soft-tissue]
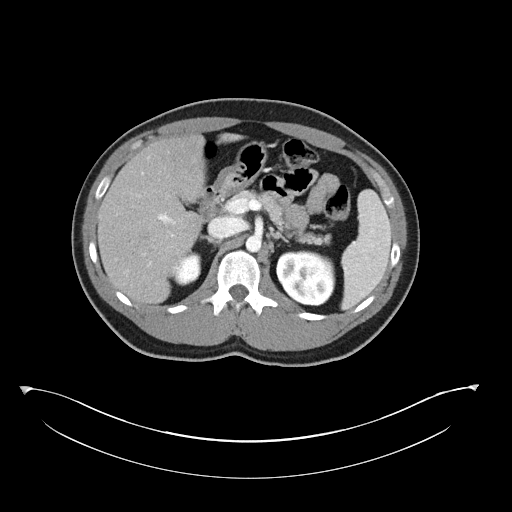
[im 77/97  soft-tissue]
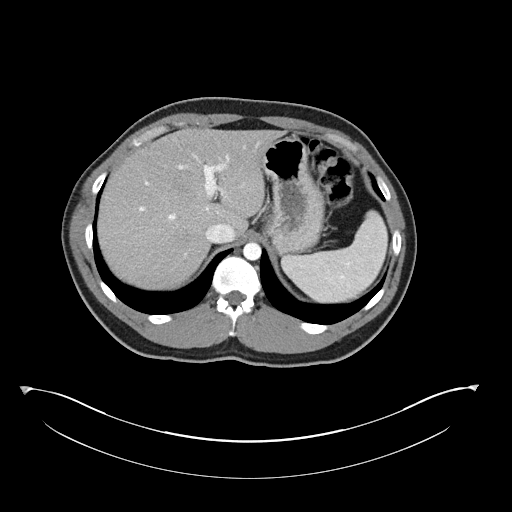
[im 85/97  soft-tissue]
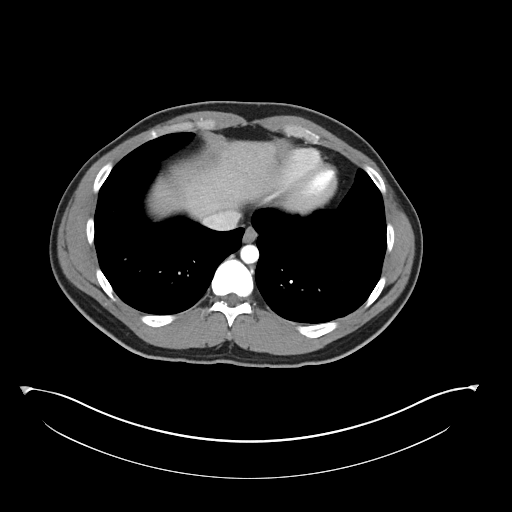
[im 93/97  soft-tissue]
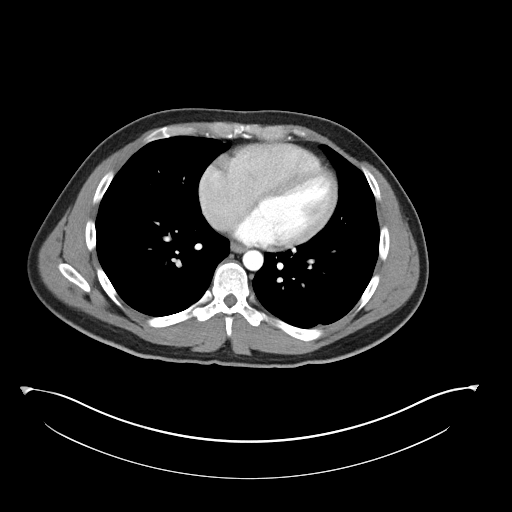

[Series 6: coronal soft tissue · coronal · 0.89mm/px · 3 of 92 slices shown]
[im 31/92  soft-tissue]
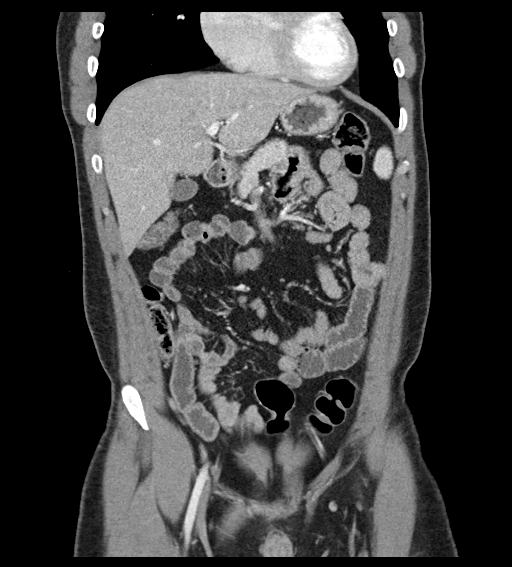
[im 41/92  soft-tissue]
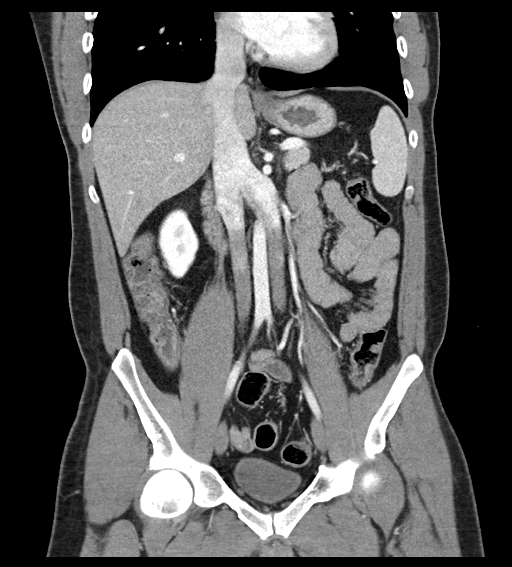
[im 51/92  soft-tissue]
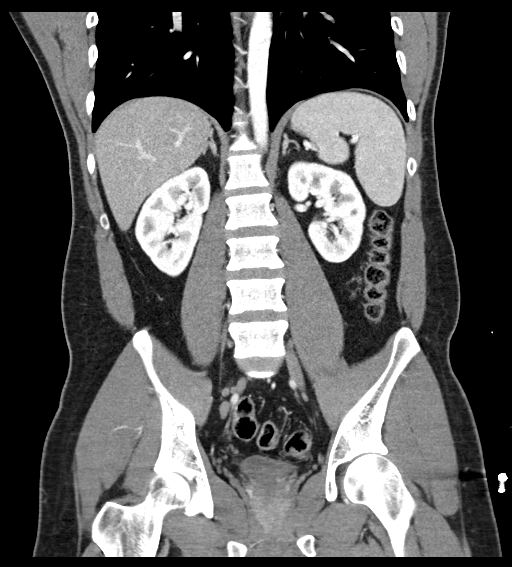

[16 of 46 positions shown; findings below may reference images not displayed]

FINDINGS: Lower chest: No acute abnormality.

Hepatobiliary: Mild fatty infiltration of the liver is noted.
Gallbladder is unremarkable. Small hypodensity is noted adjacent to
the gallbladder on image number 31 of series 3. This is incompletely
evaluated on this exam but was well seen on ultrasound from earlier
in the same day and felt to represent a small hemangioma.

Pancreas: Unremarkable. No pancreatic ductal dilatation or
surrounding inflammatory changes.

Spleen: Normal in size without focal abnormality.

Adrenals/Urinary Tract: Adrenal glands are within normal limits.
Kidneys are well visualized bilaterally. No renal calculi or urinary
tract obstructive changes are seen. Bladder is partially distended.

Stomach/Bowel: No obstructive or inflammatory changes are noted in
the colon. Stomach and small bowel appear within normal limits.
Appendix is within normal limits.

Vascular/Lymphatic: No significant vascular findings are present. No
enlarged abdominal or pelvic lymph nodes. Duplicated infrarenal IVC
is noted.

Reproductive: Prostate is unremarkable.

Other: No abdominal wall hernia or abnormality. No abdominopelvic
ascites.

Musculoskeletal: No acute or significant osseous findings.
IMPRESSION: Hypoechoic area in the liver similar to that seen on prior
ultrasound examination likely representing small hemangioma.

No other focal abnormality is noted.

## 2022-02-09 IMAGING — US US ABDOMEN LIMITED RUQ/ASCITES
1 series · 14 of 25 positions shown · non-contrast
Comparison: None.

CLINICAL DATA: Right upper quadrant pain

EXAM:
ULTRASOUND ABDOMEN LIMITED RIGHT UPPER QUADRANT

[Series 1: us abdomen limited · 14 of 59 slices shown]
[im 1/59]
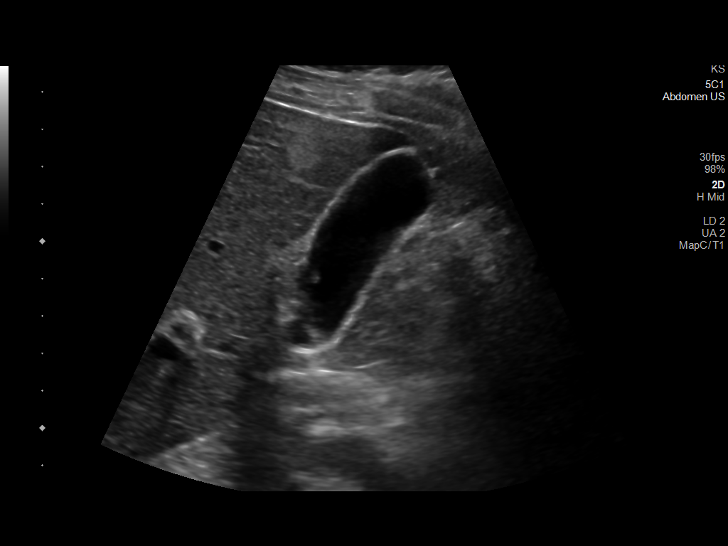
[im 5/59]
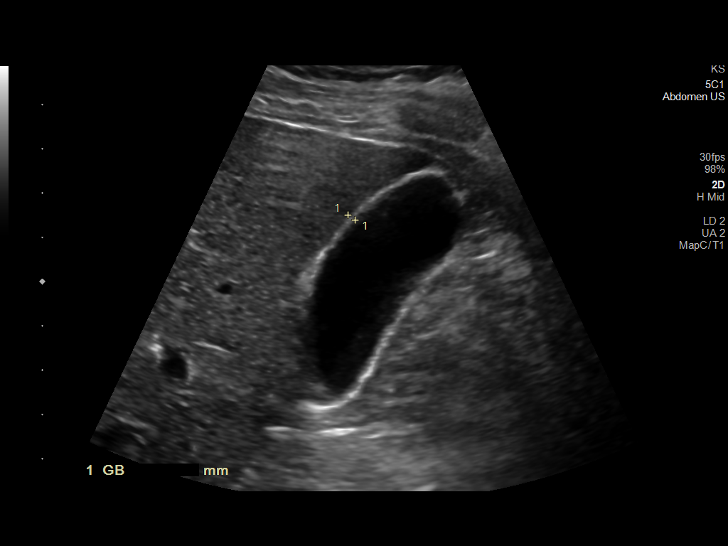
[im 10/59]
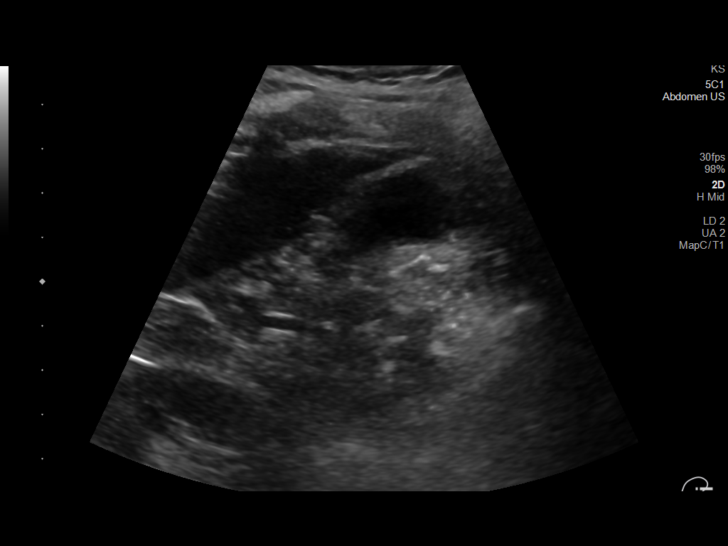
[im 15/59]
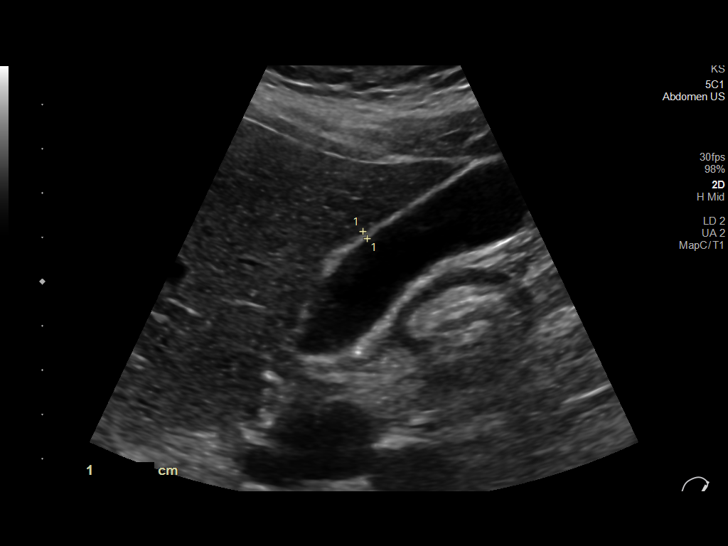
[im 20/59]
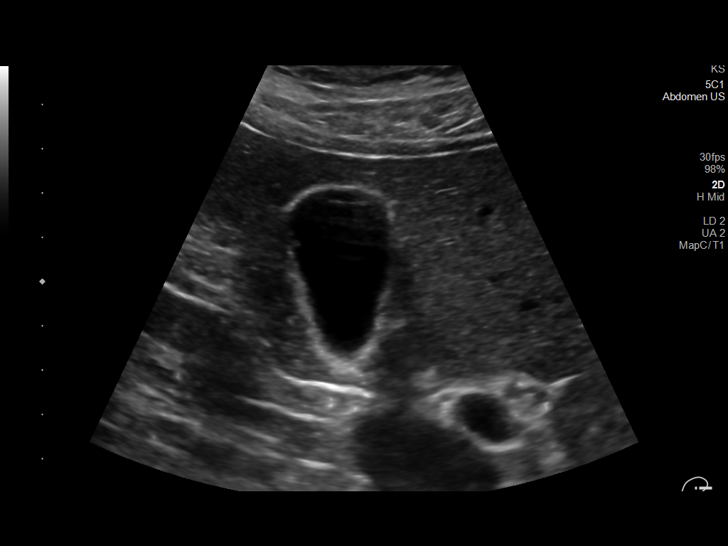
[im 22/59]
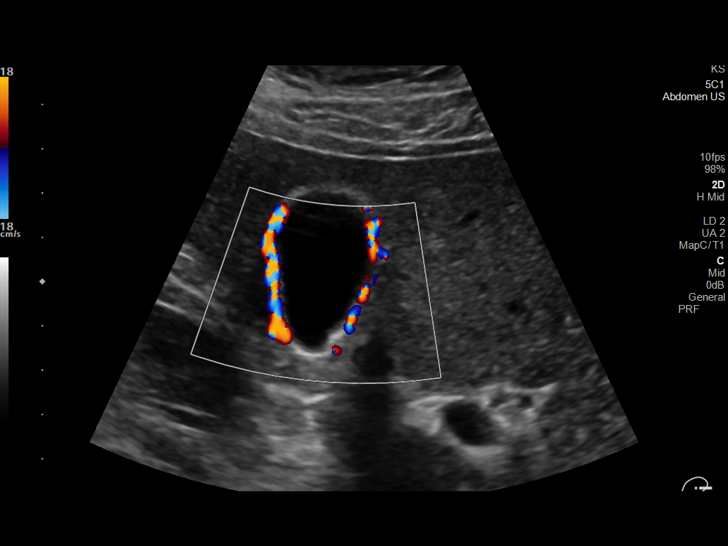
[im 27/59]
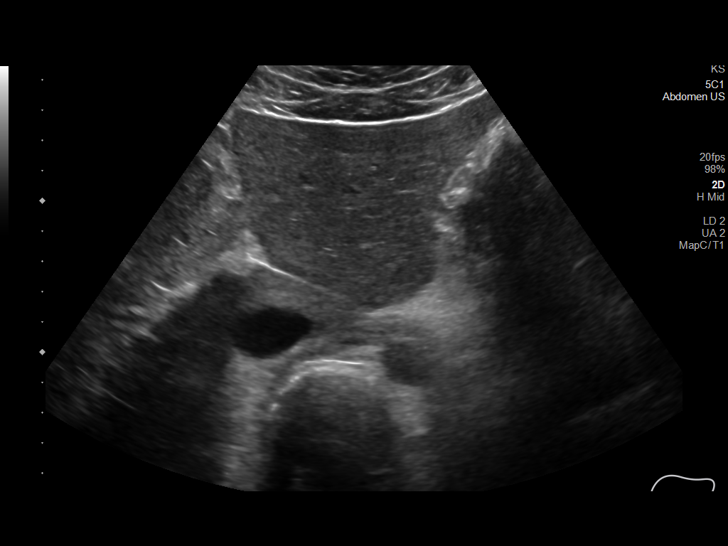
[im 32/59]
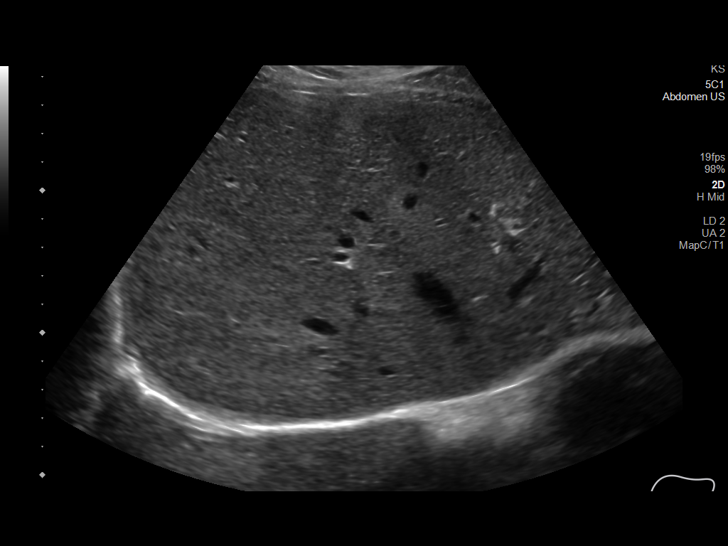
[im 37/59]
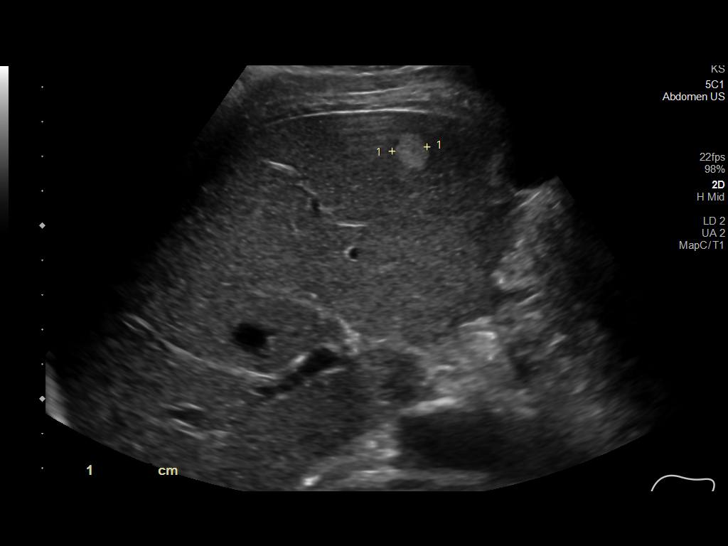
[im 39/59]
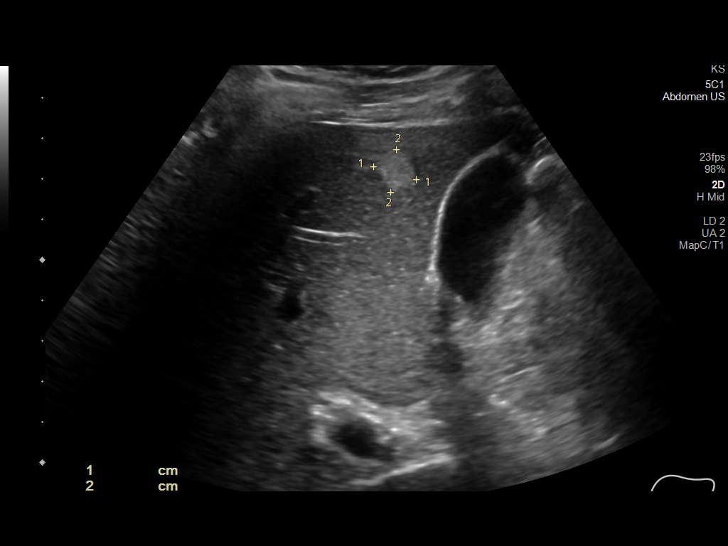
[im 44/59]
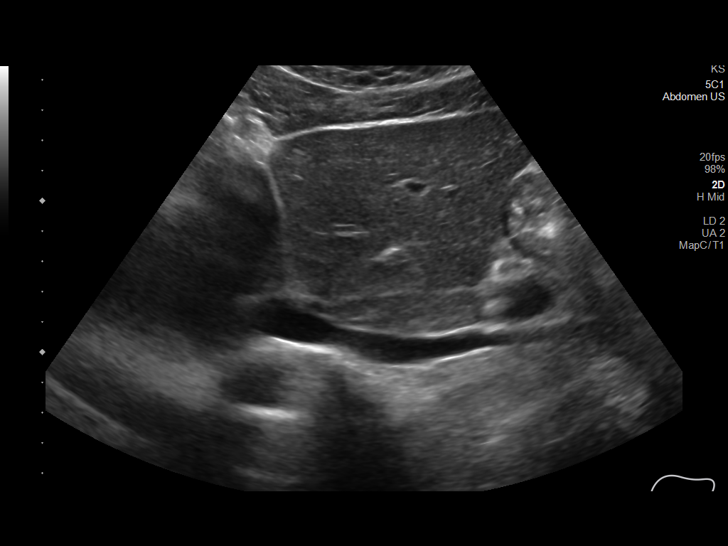
[im 49/59]
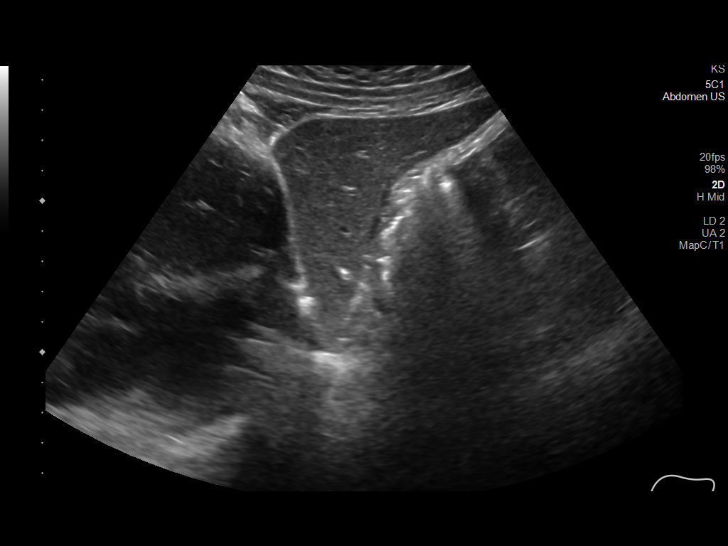
[im 54/59]
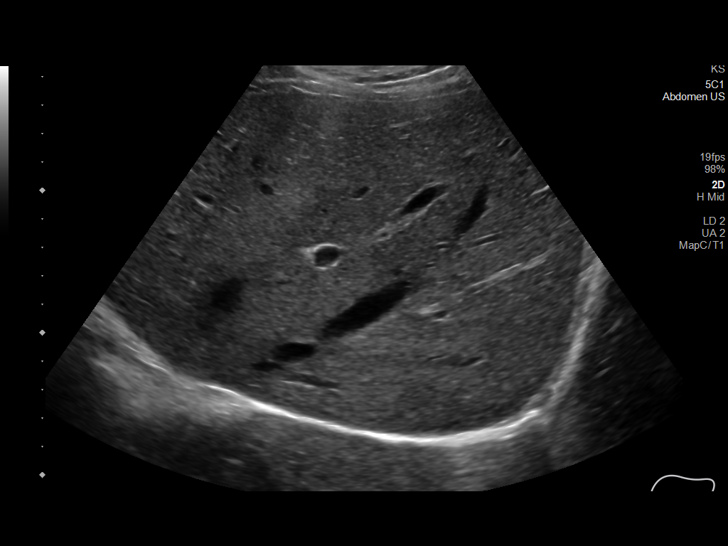
[im 59/59]
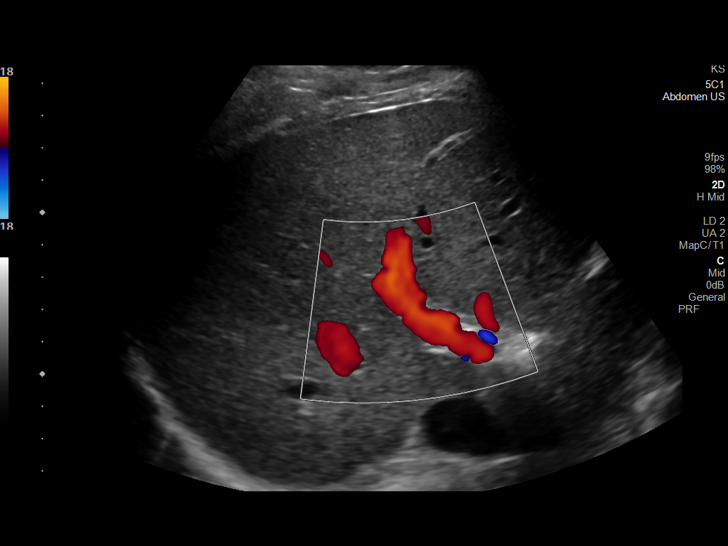

[14 of 25 positions shown; findings below may reference images not displayed]

FINDINGS: Gallbladder:

No gallstones or wall thickening visualized. There is a benign 4 mm
polyp. No sonographic Murphy sign noted by sonographer.

Common bile duct:

Diameter: 0.3 cm, within normal limits.

Liver:

There is a hyperechoic lesion in the right hepatic lobe measuring
1.1 x 1.1 x 1.0 cm. Within normal limits in parenchymal
echogenicity. Portal vein is patent on color Doppler imaging with
normal direction of blood flow towards the liver.

Other: None.
IMPRESSION: 1. No acute finding sonographically to explain the patient's right
upper quadrant pain.

2. Indeterminate hyperechoic lesion in the right hepatic lobe
measuring 1.1 cm, most likely a hemangioma. Contrast enhanced liver
MRI is recommended on a nonemergent basis for further
characterization.

3.  Benign 4 mm gallbladder polyp.

## 2022-11-07 ENCOUNTER — Other Ambulatory Visit: Payer: Self-pay | Admitting: Internal Medicine
